# Patient Record
Sex: Male | Born: 1955 | Race: Asian | Hispanic: No | Marital: Married | Smoking: Never smoker
Health system: Southern US, Community
[De-identification: ages and names within clinical notes are randomized; demographics above are authoritative.]

## PROBLEM LIST (undated history)

## (undated) DIAGNOSIS — Z789 Other specified health status: Secondary | ICD-10-CM

## (undated) DIAGNOSIS — K219 Gastro-esophageal reflux disease without esophagitis: Secondary | ICD-10-CM

## (undated) HISTORY — DX: Other specified health status: Z78.9

## (undated) HISTORY — DX: Gastro-esophageal reflux disease without esophagitis: K21.9

---

## 1986-11-22 HISTORY — PX: APPENDECTOMY: SHX54

## 2013-04-05 ENCOUNTER — Other Ambulatory Visit: Payer: Self-pay | Admitting: Family Medicine

## 2013-04-05 ENCOUNTER — Ambulatory Visit
Admission: RE | Admit: 2013-04-05 | Discharge: 2013-04-05 | Disposition: A | Discharge status: Home / Self Care | Payer: BC Managed Care – PPO | Source: Ambulatory Visit | Attending: Family Medicine | Admitting: Family Medicine

## 2013-04-05 DIAGNOSIS — Z111 Encounter for screening for respiratory tuberculosis: Secondary | ICD-10-CM

## 2014-05-25 IMAGING — CR DG CHEST 2V
2 series · 2 of 2 positions shown · non-contrast
Comparison: None.

CLINICAL DATA: Clearance for travel, evaluate for tuberculosis

CHEST - 2 VIEW

[view not recorded (1 of 2)]
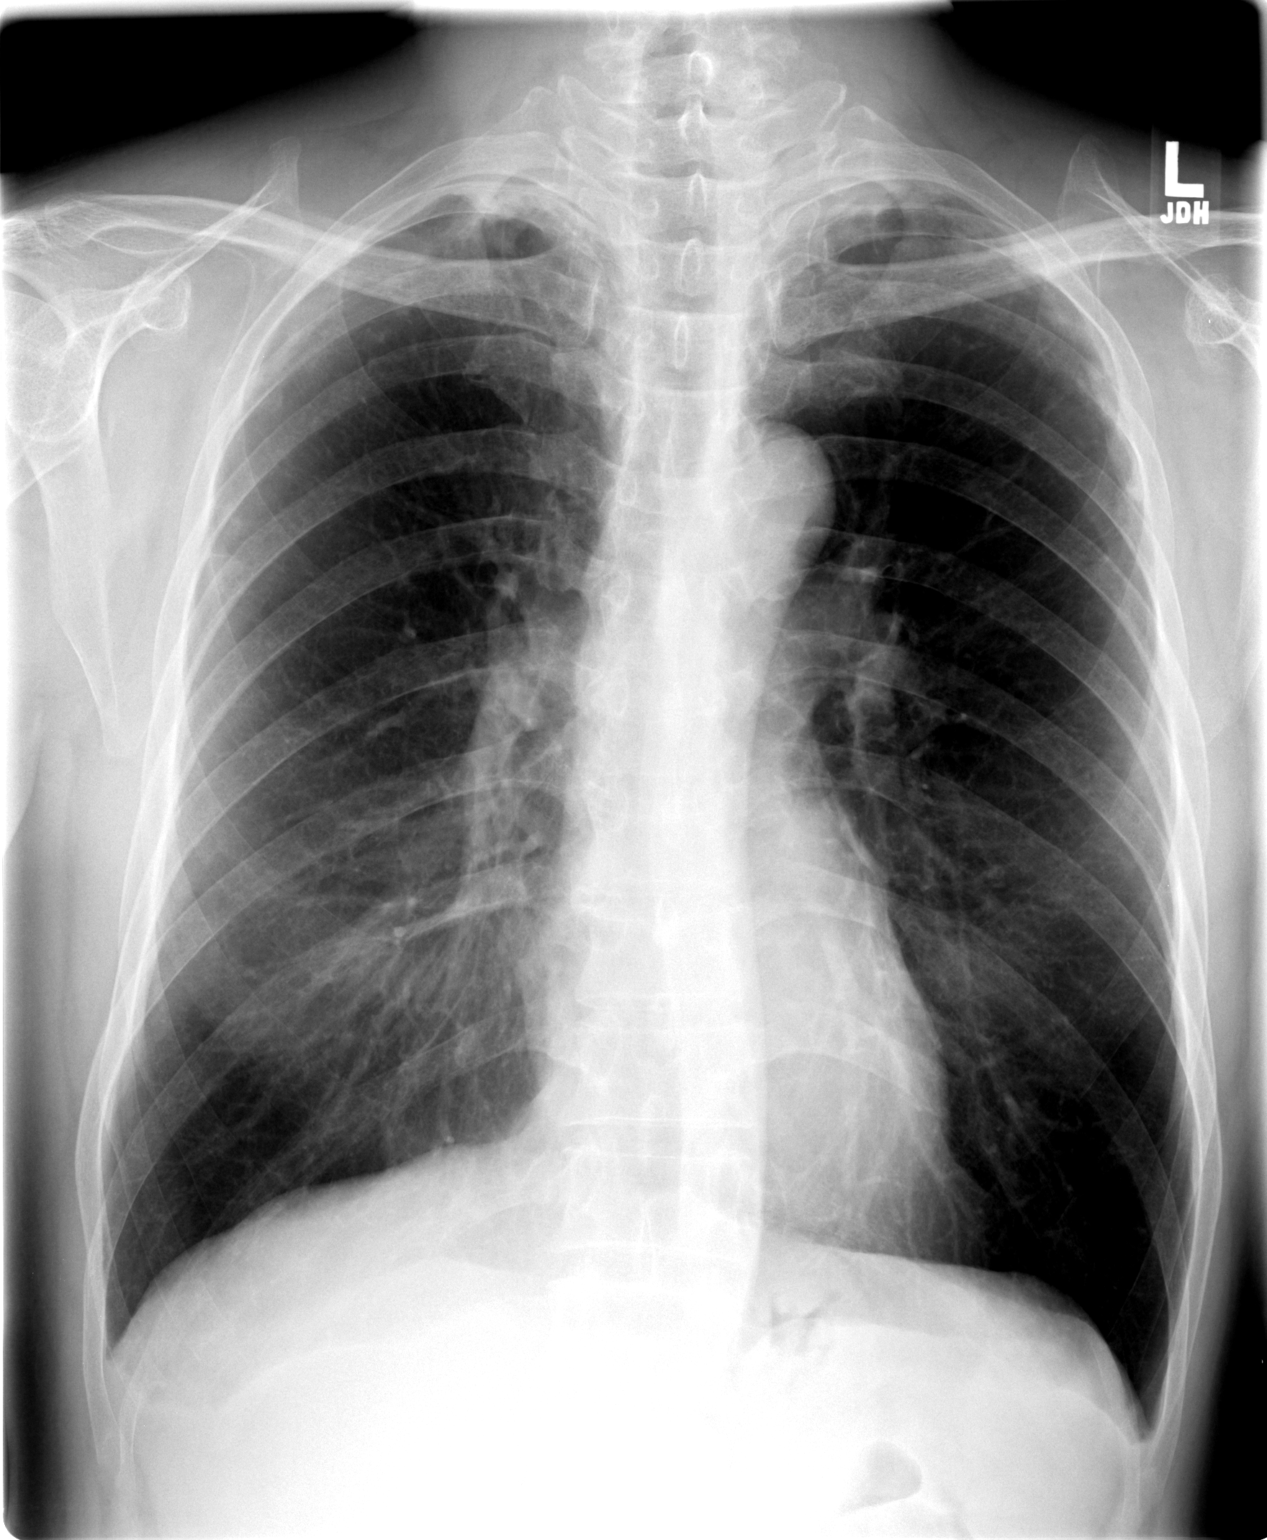

[view not recorded (2 of 2)]
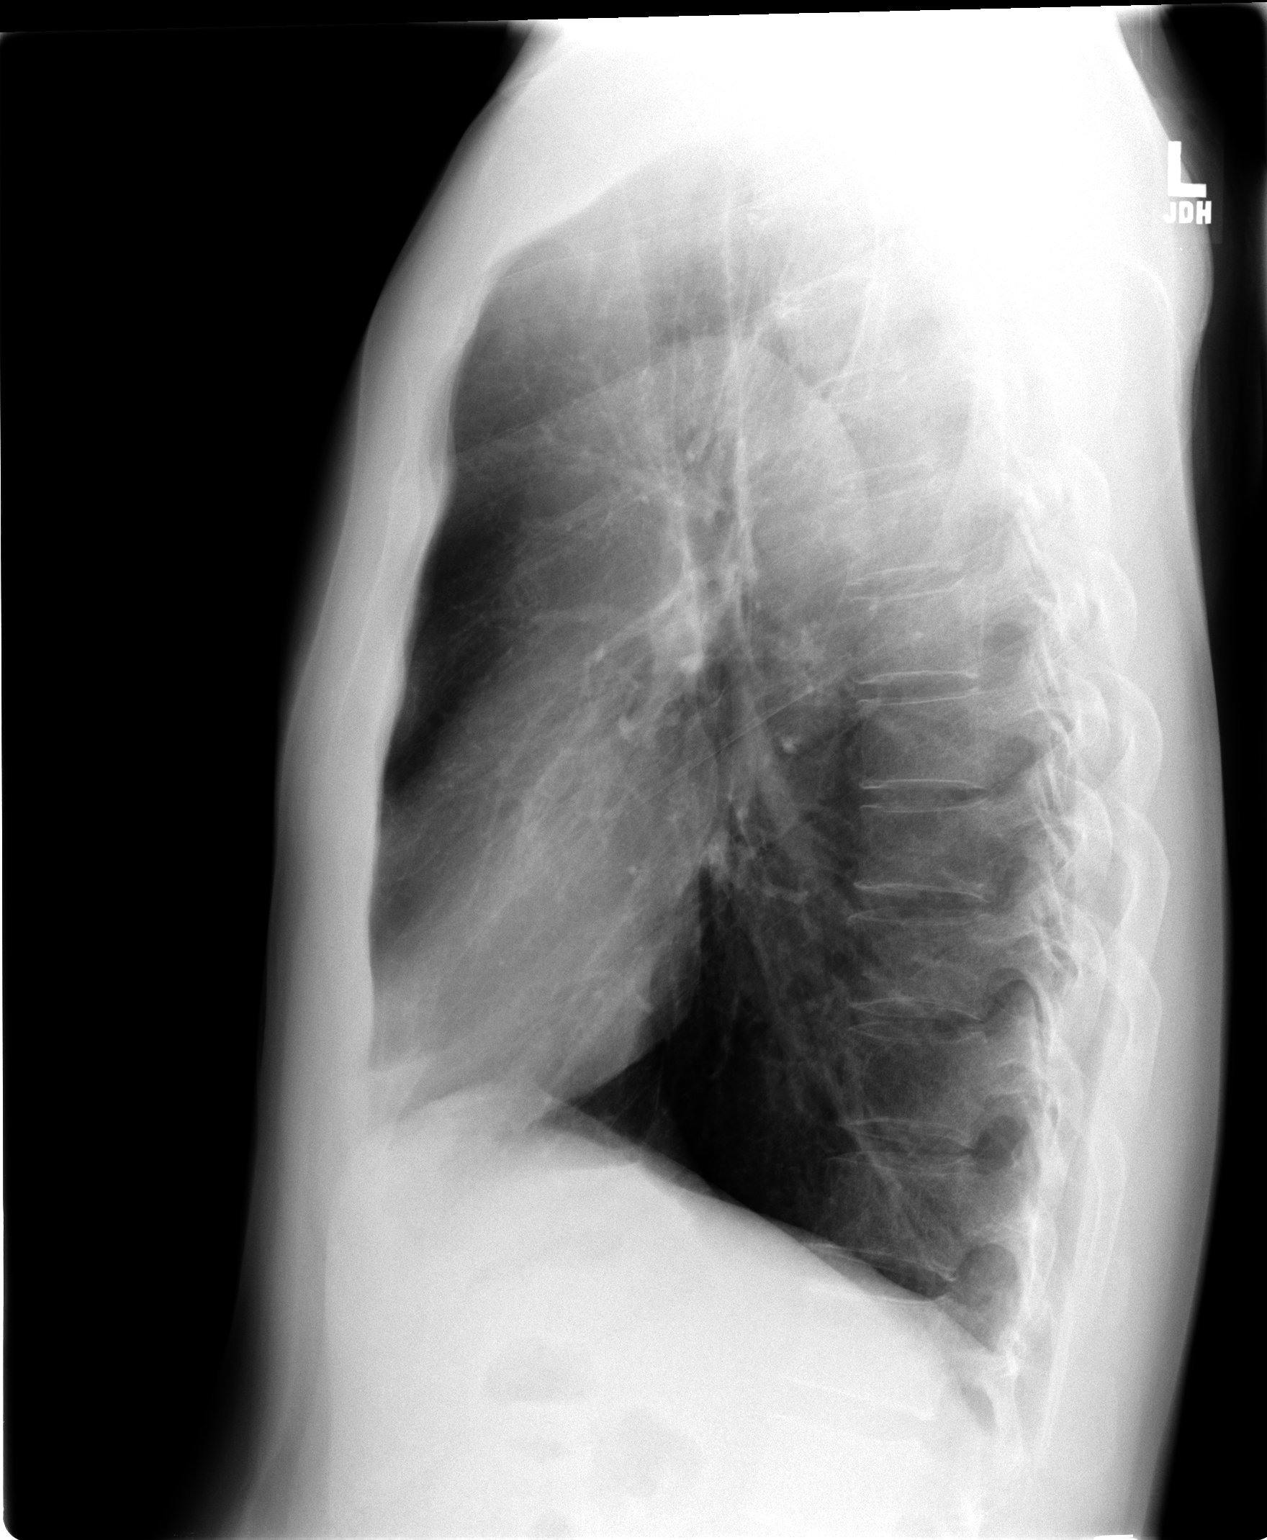

[2 of 2 positions shown; findings below may reference images not displayed]

FINDINGS: No active infiltrate or effusion is seen.  The lungs are
somewhat hyperaerated suggesting possible emphysema.  No sequela of
prior tuberculous infection is noted.  Mild apical pleural
thickening is noted.  Mediastinal contours are normal.  The heart
is within normal limits in size.  No bony abnormality is seen.
IMPRESSION: No active lung disease.  No evidence of tuberculosis.
Hyperaeration may indicate mild emphysematous change.

## 2022-04-09 ENCOUNTER — Telehealth: Payer: Self-pay | Admitting: Family Medicine

## 2022-04-09 NOTE — Telephone Encounter (Signed)
Myriam Jacobson (friend) called to set up NP appt for pt. Advised her that Dr. Carmelia Roller would have to give his approval to take him on as a pt. Please Advise.

## 2022-04-09 NOTE — Telephone Encounter (Signed)
Pt scheduled and NP packet sent. 

## 2022-04-09 NOTE — Telephone Encounter (Signed)
Ok to schedule.

## 2022-04-20 ENCOUNTER — Ambulatory Visit (INDEPENDENT_AMBULATORY_CARE_PROVIDER_SITE_OTHER): Payer: Medicare Other | Admitting: Family Medicine

## 2022-04-20 ENCOUNTER — Encounter: Payer: Self-pay | Admitting: Family Medicine

## 2022-04-20 VITALS — BP 110/78 | HR 84 | Temp 98.0°F | Ht 69.0 in | Wt 143.0 lb

## 2022-04-20 DIAGNOSIS — Z23 Encounter for immunization: Secondary | ICD-10-CM | POA: Diagnosis not present

## 2022-04-20 DIAGNOSIS — Z125 Encounter for screening for malignant neoplasm of prostate: Secondary | ICD-10-CM | POA: Diagnosis not present

## 2022-04-20 DIAGNOSIS — Z13220 Encounter for screening for lipoid disorders: Secondary | ICD-10-CM

## 2022-04-20 DIAGNOSIS — Z8781 Personal history of (healed) traumatic fracture: Secondary | ICD-10-CM

## 2022-04-20 DIAGNOSIS — Z0000 Encounter for general adult medical examination without abnormal findings: Secondary | ICD-10-CM

## 2022-04-20 DIAGNOSIS — R7301 Impaired fasting glucose: Secondary | ICD-10-CM | POA: Diagnosis not present

## 2022-04-20 NOTE — Progress Notes (Signed)
Chief Complaint  Patient presents with   New Patient (Initial Visit)    Well Male Micheal Hanson is here for a complete physical.   His last physical was >1 year ago.  Current diet: in general, a "healthy" diet.   Current exercise: pushups, walking Weight trend: stable Fatigue out of ordinary? No. Seat belt? Yes.   Advanced directive? No  Health maintenance Shingrix- No Colonoscopy- Yes Tetanus- Yes Hep C- Yes Pneumonia vaccine- No  Past Medical History:  Diagnosis Date   No known health problems      Past Surgical History:  Procedure Laterality Date   APPENDECTOMY  1988    Medications  Takes no meds routinely.     Allergies No Known Allergies  Family History Family History  Problem Relation Age of Onset   Diabetes Father    Cancer Neg Hx     Review of Systems: Constitutional:  no fevers Eye:  no recent significant change in vision Ears:  No changes in hearing Nose/Mouth/Throat:  no complaints of nasal congestion, no sore throat Cardiovascular: no chest pain Respiratory:  No shortness of breath Gastrointestinal:  No change in bowel habits GU:  No frequency Integumentary:  no abnormal skin lesions reported Neurologic:  no headaches Endocrine:  denies unexplained weight changes  Exam BP 110/78   Pulse 84   Temp 98 F (36.7 C) (Oral)   Ht 5\' 9"  (1.753 m)   Wt 143 lb (64.9 kg)   SpO2 99%   BMI 21.12 kg/m  General:  well developed, well nourished, in no apparent distress Skin:  no significant moles, warts, or growths Head:  no masses, lesions, or tenderness Eyes:  pupils equal and round, sclera anicteric without injection Ears:  canals without lesions, TMs shiny without retraction, no obvious effusion, no erythema Nose:  nares patent, septum midline, mucosa normal Throat/Pharynx:  lips and gingiva without lesion; tongue and uvula midline; non-inflamed pharynx; no exudates or postnasal drainage Lungs:  clear to auscultation, breath sounds equal  bilaterally, no respiratory distress Cardio:  regular rate and rhythm, no LE edema or bruits Rectal: Deferred GI: BS+, S, NT, ND, no masses or organomegaly Musculoskeletal:  symmetrical muscle groups noted without atrophy or deformity Neuro:  gait normal; deep tendon reflexes normal and symmetric Psych: well oriented with normal range of affect and appropriate judgment/insight  Assessment and Plan  Well adult exam  IFG (impaired fasting glucose) - Plan: Comprehensive metabolic panel, Hemoglobin A1c  Screening for lipid disorders - Plan: Lipid panel  Screening for prostate cancer - Plan: PSA, Medicare ( [place] only)  History of fracture of wrist - Plan: DG Bone Density   Well 66 y.o. male. Counseled on diet and exercise. Shingrix rec'd.  PCV20 today. UTD w covid vaccines.  Advanced directive form provided today.  Other orders as above. Follow up in 1 yr or prn.  The patient voiced understanding and agreement to the plan.  [place], DO 04/20/22 3:08 PM

## 2022-04-20 NOTE — Patient Instructions (Addendum)
Give [place] 2-3 business days to get the results of your labs back.   Keep the diet clean and stay active.  Please get me a copy of your advanced directive form at your convenience.   Someone will reach out regarding your bone density scan.   You are up to date with the tetanus shot.   You shouldn't need another pneumonia shot after this one unless something new comes out.   Let us know if you need anything.

## 2022-04-20 NOTE — Addendum Note (Signed)
Addended by: Kathi Ludwig on: 04/20/2022 03:18 PM   Modules accepted: Orders

## 2022-04-21 LAB — COMPREHENSIVE METABOLIC PANEL
ALT: 16 U/L (ref 0–53)
AST: 18 U/L (ref 0–37)
Albumin: 4.6 g/dL (ref 3.5–5.2)
Alkaline Phosphatase: 65 U/L (ref 39–117)
BUN: 14 mg/dL (ref 6–23)
CO2: 31 mEq/L (ref 19–32)
Calcium: 9.7 mg/dL (ref 8.4–10.5)
Chloride: 100 mEq/L (ref 96–112)
Creatinine, Ser: 0.99 mg/dL (ref 0.40–1.50)
GFR: 79.57 mL/min (ref >60.00)
Glucose, Bld: 85 mg/dL (ref 70–99)
Potassium: 3.9 mEq/L (ref 3.5–5.1)
Sodium: 140 mEq/L (ref 135–145)
Total Bilirubin: 1 mg/dL (ref 0.2–1.2)
Total Protein: 7.4 g/dL (ref 6.0–8.3)

## 2022-04-21 LAB — PSA, MEDICARE: PSA: 1.15 ng/ml (ref 0.10–4.00)

## 2022-04-21 LAB — LIPID PANEL
Cholesterol: 197 mg/dL (ref 0–200)
HDL: 56.4 mg/dL (ref >39.00)
LDL Cholesterol: 125 mg/dL — ABNORMAL HIGH (ref 0–99)
NonHDL: 140.98
Total CHOL/HDL Ratio: 3
Triglycerides: 78 mg/dL (ref 0.0–149.0)
VLDL: 15.6 mg/dL (ref 0.0–40.0)

## 2022-04-21 LAB — HEMOGLOBIN A1C: Hgb A1c MFr Bld: 5.6 % (ref 4.6–6.5)

## 2022-04-29 ENCOUNTER — Ambulatory Visit (HOSPITAL_BASED_OUTPATIENT_CLINIC_OR_DEPARTMENT_OTHER)
Admission: RE | Admit: 2022-04-29 | Discharge: 2022-04-29 | Disposition: A | Discharge status: Home / Self Care | Payer: Medicare Other | Source: Ambulatory Visit | Attending: Family Medicine | Admitting: Family Medicine

## 2022-04-29 DIAGNOSIS — M8589 Other specified disorders of bone density and structure, multiple sites: Secondary | ICD-10-CM | POA: Insufficient documentation

## 2022-04-29 DIAGNOSIS — Z13820 Encounter for screening for osteoporosis: Secondary | ICD-10-CM | POA: Diagnosis not present

## 2022-04-29 DIAGNOSIS — Z8781 Personal history of (healed) traumatic fracture: Secondary | ICD-10-CM | POA: Diagnosis not present

## 2022-05-04 ENCOUNTER — Telehealth: Payer: Self-pay

## 2022-05-04 NOTE — Telephone Encounter (Signed)
Micheal Hanson called- Pt was charged $25 co pay at his visit on 04/20/22- but insurance card states that copay is $0- she would like to know why Pt was required to pay a copay. She can be reached at [phone].

## 2022-05-05 NOTE — Telephone Encounter (Signed)
Left a message on Micheal Hanson's VM to call office back regarding the $25 co-payment on 04/20/22.  Sometimes the system will pull a payment that is not accurate and in this case it did.  Billing Leadership will make a credit to the patient's account in the amount of $25.00 for the correction of the mistake.

## 2022-05-18 DIAGNOSIS — H524 Presbyopia: Secondary | ICD-10-CM | POA: Diagnosis not present

## 2022-05-18 DIAGNOSIS — H2513 Age-related nuclear cataract, bilateral: Secondary | ICD-10-CM | POA: Diagnosis not present

## 2022-05-18 DIAGNOSIS — H52223 Regular astigmatism, bilateral: Secondary | ICD-10-CM | POA: Diagnosis not present

## 2022-05-18 DIAGNOSIS — H5213 Myopia, bilateral: Secondary | ICD-10-CM | POA: Diagnosis not present

## 2023-06-14 ENCOUNTER — Telehealth: Payer: Self-pay | Admitting: Family Medicine

## 2023-06-14 NOTE — Telephone Encounter (Signed)
Copied from CRM [id number]. Topic: Medicare AWV >> Jun 14, 2023  1:59 PM Payton Doughty wrote: Reason for CRM: Called 06/14/2023 to sched AWV - NO VOICEMAIL  Verlee Rossetti; Care Guide Ambulatory Clinical Support [facility] Direct Dial: [phone]

## 2024-01-17 ENCOUNTER — Ambulatory Visit (INDEPENDENT_AMBULATORY_CARE_PROVIDER_SITE_OTHER): Payer: Medicare Other | Admitting: Family Medicine

## 2024-01-17 ENCOUNTER — Other Ambulatory Visit (HOSPITAL_BASED_OUTPATIENT_CLINIC_OR_DEPARTMENT_OTHER): Payer: Self-pay

## 2024-01-17 ENCOUNTER — Encounter: Payer: Self-pay | Admitting: Family Medicine

## 2024-01-17 VITALS — BP 130/78 | HR 92 | Temp 98.0°F | Resp 16 | Ht 69.0 in | Wt 140.2 lb

## 2024-01-17 DIAGNOSIS — H9193 Unspecified hearing loss, bilateral: Secondary | ICD-10-CM | POA: Diagnosis not present

## 2024-01-17 DIAGNOSIS — Z125 Encounter for screening for malignant neoplasm of prostate: Secondary | ICD-10-CM | POA: Diagnosis not present

## 2024-01-17 DIAGNOSIS — E7800 Pure hypercholesterolemia, unspecified: Secondary | ICD-10-CM

## 2024-01-17 DIAGNOSIS — Z0000 Encounter for general adult medical examination without abnormal findings: Secondary | ICD-10-CM | POA: Diagnosis not present

## 2024-01-17 DIAGNOSIS — R634 Abnormal weight loss: Secondary | ICD-10-CM | POA: Diagnosis not present

## 2024-01-17 MED ORDER — ZOSTER VAC RECOMB ADJUVANTED 50 MCG/0.5ML IM SUSR
0.5000 mL | Freq: Once | INTRAMUSCULAR | 0 refills | Status: AC
  Filled 2024-01-17: qty 0.5, 1d supply, fill #0

## 2024-01-17 MED ORDER — TETANUS-DIPHTH-ACELL PERTUSSIS 5-2.5-18.5 LF-MCG/0.5 IM SUSY
0.5000 mL | PREFILLED_SYRINGE | Freq: Once | INTRAMUSCULAR | 0 refills | Status: AC
  Filled 2024-01-17: qty 0.5, 1d supply, fill #0

## 2024-01-17 NOTE — Progress Notes (Signed)
 Chief Complaint  Patient presents with   Follow-up    Follow up    Well Male Micheal Hanson is here for a complete physical.  Here w wife and friend who helps interpret.  His last physical was >1 year ago.  Current diet: in general, a "healthy" diet.   Current exercise: walking Weight trend: lost a few lbs Fatigue out of ordinary? Not out of ordinary. Seat belt? Yes.   Advanced directive? No  Health maintenance Shingrix- No Colonoscopy- Yes Tetanus- Due Hep C- Yes Pneumonia vaccine- Yes  Past Medical History:  Diagnosis Date   No known health problems      Past Surgical History:  Procedure Laterality Date   APPENDECTOMY  1988    Medications  Takes no meds routinely.    Allergies No Known Allergies  Family History Family History  Problem Relation Age of Onset   Diabetes Father    Cancer Neg Hx     Review of Systems: Constitutional:  no fevers Eye:  no recent significant change in vision Ears:  + Hearing loss Nose/Mouth/Throat:  no complaints of nasal congestion, no sore throat Cardiovascular: no chest pain Respiratory:  No shortness of breath Gastrointestinal:  No change in bowel habits GU:  No frequency Integumentary:  no abnormal skin lesions reported Neurologic:  no headaches Endocrine:  denies unexplained weight changes  Exam BP 130/78 (BP Location: Left Arm, Patient Position: Sitting)   Pulse 92   Temp 98 F (36.7 C) (Oral)   Resp 16   Ht 5\' 9"  (1.753 m)   Wt 140 lb 3.2 oz (63.6 kg)   SpO2 97%   BMI 20.70 kg/m  General:  well developed, well nourished, in no apparent distress Skin:  no significant moles, warts, or growths Head:  no masses, lesions, or tenderness Eyes:  pupils equal and round, sclera anicteric without injection Ears: 80% of canal with cerumen obstruction on the right, otherwise canals without lesions, TMs shiny without retraction, no obvious effusion, no erythema Nose:  nares patent, mucosa normal Throat/Pharynx:  lips and  gingiva without lesion; tongue and uvula midline; non-inflamed pharynx; no exudates or postnasal drainage Lungs:  clear to auscultation, breath sounds equal bilaterally, no respiratory distress Cardio:  regular rate and rhythm, no LE edema or bruits Rectal: Deferred GI: BS+, S, NT, ND, no masses or organomegaly Musculoskeletal:  symmetrical muscle groups noted without atrophy or deformity Neuro:  gait normal; deep tendon reflexes normal and symmetric Psych: well oriented with normal range of affect and appropriate judgment/insight  Assessment and Plan  Well adult exam  Pure hypercholesterolemia - Plan: Comprehensive metabolic panel, Lipid panel  Screening for prostate cancer - Plan: PSA, Medicare ( [place] only)   Well 68 y.o. male. Counseled on diet and exercise. Tetanus booster and Shingrix rec'd to get at pharmacy.  Advanced directive form provided today.  Other orders as above. Follow up in 1 year.  Annual wellness visit at convenience. The patient, his spouse, and his friend voiced understanding and agreement to the plan.  Jilda Roche [place], DO 01/17/24 2:08 PM

## 2024-01-17 NOTE — Patient Instructions (Addendum)
 Give [place] 2-3 business days to get the results of your labs back.   Keep the diet clean and stay active.  Please get me a copy of your advanced directive form at your convenience.   The Shingrix vaccine (for shingles) is a 2 shot series spaced 2-6 months apart. It can make people feel low energy, achy and almost like they have the flu for 48 hours after injection. 1/5 people can have nausea and/or vomiting. Please plan accordingly when deciding on when to get this shot. Call your pharmacy to get this. The second shot of the series is less severe regarding the side effects, but it still lasts 48 hours.   Please consider getting the tetanus booster at the pharmacy.   If you do not hear anything about your referral in the next 1-2 weeks, call our office and ask for an update.  Let us know if you need anything.

## 2024-01-20 ENCOUNTER — Other Ambulatory Visit (INDEPENDENT_AMBULATORY_CARE_PROVIDER_SITE_OTHER): Payer: Medicare Other

## 2024-01-20 ENCOUNTER — Other Ambulatory Visit (HOSPITAL_BASED_OUTPATIENT_CLINIC_OR_DEPARTMENT_OTHER): Payer: Self-pay

## 2024-01-20 ENCOUNTER — Encounter: Payer: Self-pay | Admitting: Family Medicine

## 2024-01-20 ENCOUNTER — Other Ambulatory Visit: Payer: Self-pay

## 2024-01-20 DIAGNOSIS — R634 Abnormal weight loss: Secondary | ICD-10-CM | POA: Diagnosis not present

## 2024-01-20 DIAGNOSIS — Z125 Encounter for screening for malignant neoplasm of prostate: Secondary | ICD-10-CM

## 2024-01-20 DIAGNOSIS — E7800 Pure hypercholesterolemia, unspecified: Secondary | ICD-10-CM

## 2024-01-20 DIAGNOSIS — R7989 Other specified abnormal findings of blood chemistry: Secondary | ICD-10-CM

## 2024-01-20 LAB — TSH: TSH: 5.67 u[IU]/mL — ABNORMAL HIGH (ref 0.35–5.50)

## 2024-01-20 LAB — LIPID PANEL
Cholesterol: 164 mg/dL (ref 0–200)
HDL: 61.2 mg/dL (ref >39.00)
LDL Cholesterol: 92 mg/dL (ref 0–99)
NonHDL: 102.47
Total CHOL/HDL Ratio: 3
Triglycerides: 50 mg/dL (ref 0.0–149.0)
VLDL: 10 mg/dL (ref 0.0–40.0)

## 2024-01-20 LAB — PSA, MEDICARE: PSA: 1.51 ng/mL (ref 0.10–4.00)

## 2024-01-20 LAB — COMPREHENSIVE METABOLIC PANEL
ALT: 34 U/L (ref 0–53)
AST: 28 U/L (ref 0–37)
Albumin: 4.5 g/dL (ref 3.5–5.2)
Alkaline Phosphatase: 67 U/L (ref 39–117)
BUN: 17 mg/dL (ref 6–23)
CO2: 33 meq/L — ABNORMAL HIGH (ref 19–32)
Calcium: 9.7 mg/dL (ref 8.4–10.5)
Chloride: 101 meq/L (ref 96–112)
Creatinine, Ser: 0.93 mg/dL (ref 0.40–1.50)
GFR: 84.72 mL/min (ref >60.00)
Glucose, Bld: 97 mg/dL (ref 70–99)
Potassium: 4.6 meq/L (ref 3.5–5.1)
Sodium: 141 meq/L (ref 135–145)
Total Bilirubin: 0.6 mg/dL (ref 0.2–1.2)
Total Protein: 7.6 g/dL (ref 6.0–8.3)

## 2024-01-23 ENCOUNTER — Other Ambulatory Visit

## 2024-01-23 DIAGNOSIS — R7989 Other specified abnormal findings of blood chemistry: Secondary | ICD-10-CM | POA: Diagnosis not present

## 2024-01-24 ENCOUNTER — Encounter: Payer: Self-pay | Admitting: Family Medicine

## 2024-01-24 LAB — T4, FREE: Free T4: 1 ng/dL (ref 0.8–1.8)

## 2024-01-25 ENCOUNTER — Encounter: Payer: Self-pay | Admitting: Gastroenterology

## 2024-03-01 DIAGNOSIS — H2513 Age-related nuclear cataract, bilateral: Secondary | ICD-10-CM | POA: Diagnosis not present

## 2024-03-14 ENCOUNTER — Encounter: Payer: Self-pay | Admitting: Gastroenterology

## 2024-03-14 ENCOUNTER — Other Ambulatory Visit (HOSPITAL_BASED_OUTPATIENT_CLINIC_OR_DEPARTMENT_OTHER): Payer: Self-pay

## 2024-03-14 ENCOUNTER — Ambulatory Visit: Admitting: Gastroenterology

## 2024-03-14 VITALS — BP 108/70 | HR 76 | Ht 69.0 in | Wt 145.2 lb

## 2024-03-14 DIAGNOSIS — K219 Gastro-esophageal reflux disease without esophagitis: Secondary | ICD-10-CM | POA: Diagnosis not present

## 2024-03-14 DIAGNOSIS — Z1211 Encounter for screening for malignant neoplasm of colon: Secondary | ICD-10-CM | POA: Insufficient documentation

## 2024-03-14 MED ORDER — NA SULFATE-K SULFATE-MG SULF 17.5-3.13-1.6 GM/177ML PO SOLN
1.0000 | Freq: Once | ORAL | 0 refills | Status: AC
  Filled 2024-03-14: qty 354, 1d supply, fill #0

## 2024-03-14 NOTE — Patient Instructions (Signed)
 You have been scheduled for a colonoscopy. Please follow written instructions given to you at your visit today.   If you use inhalers (even only as needed), please bring them with you on the day of your procedure.  DO NOT TAKE 7 DAYS PRIOR TO TEST- Trulicity (dulaglutide) Ozempic, Wegovy (semaglutide) Mounjaro (tirzepatide) Bydureon Bcise (exanatide extended release)  DO NOT TAKE 1 DAY PRIOR TO YOUR TEST Rybelsus (semaglutide) Adlyxin (lixisenatide) Victoza (liraglutide) Byetta (exanatide) _____________________________________________________________________  _______________________________________________________  If your blood pressure at your visit was 140/90 or greater, please contact your primary care physician to follow up on this.  _______________________________________________________  If you are age 13 or older, your body mass index should be between 23-30. Your Body mass index is 21.44 kg/m. If this is out of the aforementioned range listed, please consider follow up with your Primary Care Provider.  If you are age 39 or younger, your body mass index should be between 19-25. Your Body mass index is 21.44 kg/m. If this is out of the aformentioned range listed, please consider follow up with your Primary Care Provider.   ________________________________________________________  The [facility] providers would like to encourage you to use MYCHART to communicate with providers for non-urgent requests or questions.  Due to long hold times on the telephone, sending your provider a message by [facility] may be a faster and more efficient way to get a response.  Please allow 48 business hours for a response.  Please remember that this is for non-urgent requests.  _______________________________________________________

## 2024-03-14 NOTE — Progress Notes (Signed)
 03/14/2024 Micheal Hanson [id number] May 30, 1956   HISTORY OF PRESENT ILLNESS: This is a 68 year old male who is new to our office.  He is here today for evaluation of GERD.  He is primarily [place] speaking so communication was performed via interpreter/translator.  The patient's wife also had a visit with me today and she was present during the entire visit as well.  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  They live primarily in [place] , but will be leaving to go to [place] at the end of June for the next 4 years or so.  He tells me that he had a colonoscopy in [place] about 8 years ago that he says was normal.  We do not have records.  He would like to have colonoscopy performed before he leaves the country again for the next few years.  Says he also has heartburn and reflux as well as a lot of belching.  He says that it has been going on for 15 to 20 years.  He was diagnosed with H. pylori in [place] about 8 years ago during endoscopy.  He has not taken any medication for symptoms, uses nothing over-the-counter as he prefers not to take medication.  No significant bowel issues, but stools do seem to vary according to what he eats.  No rectal bleeding.   Past Medical History:  Diagnosis Date   GERD (gastroesophageal reflux disease)    No known health problems    Past Surgical History:  Procedure Laterality Date   APPENDECTOMY  1988    reports that he has never smoked. He has never used smokeless tobacco. He reports that he does not currently use alcohol. He reports that he does not use drugs. family history includes Diabetes in his brother and father; Irritable bowel syndrome in his brother and sister. No Known Allergies    Outpatient Encounter Medications as of 03/14/2024  Medication Sig   Na Sulfate-K Sulfate-Mg Sulfate concentrate (SUPREP) 17.5-3.13-1.6 GM/177ML SOLN Take 1 kit (354 mLs total) by mouth once for 1 dose.   No facility-administered encounter  medications on file as of 03/14/2024.    REVIEW OF SYSTEMS  : All other systems reviewed and negative except where noted in the History of Present Illness.   PHYSICAL EXAM: BP 108/70   Pulse 76   Ht 5\' 9"  (1.753 m)   Wt 145 lb 3.2 oz (65.9 kg)   BMI 21.44 kg/m  General: Well developed [place] male in no acute distress Head: Normocephalic and atraumatic Eyes:  Sclerae anicteric, conjunctiva pink. Ears: Normal auditory acuity Lungs: Clear throughout to auscultation; no W/R/R. Heart: Regular rate and rhythm; no M/R/G. Rectal:  Will be done during the time of colonoscopy. Musculoskeletal: Symmetrical with no gross deformities  Skin: No lesions on visible extremities Neurological: Alert oriented x 4, grossly non-focal Psychological:  Alert and cooperative. Normal mood and affect  ASSESSMENT AND PLAN: *Colon cancer screening: Patient reports normal colonoscopy roughly 8 years ago in [place].  No records available.  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  He would like to have this repeated before he goes.  Will schedule Dr. Cherryl Corona. *GERD: Reports heartburn/reflux and belching for 15 to 20 years.  EGD about 8 years ago in [place] diagnosed H. pylori.  He does not use anything for his  symptoms, does not use anything over-the-counter.  He says he prefers not to take medication.  Will evaluate with EGD as well.  **The risks, benefits, and alternatives to EGD and colonoscopy were discussed with the patient and he consents to proceed.   **Of note, the patient and his wife wanted to have the procedures performed on the same day and had a particular day that they were requesting so they were scheduled with Dr. Cherryl Corona.  CC:  Gwenette Lennox, Shellie Dials*

## 2024-03-15 ENCOUNTER — Other Ambulatory Visit (HOSPITAL_BASED_OUTPATIENT_CLINIC_OR_DEPARTMENT_OTHER): Payer: Self-pay

## 2024-03-21 NOTE — Progress Notes (Signed)
 Agree with the assessment and plan as outlined by Doug Sou, PA-C.  Caisley Baxendale E. Tomasa Rand, MD  [facility]

## 2024-04-10 ENCOUNTER — Ambulatory Visit (INDEPENDENT_AMBULATORY_CARE_PROVIDER_SITE_OTHER): Admitting: Family Medicine

## 2024-04-10 ENCOUNTER — Other Ambulatory Visit (HOSPITAL_BASED_OUTPATIENT_CLINIC_OR_DEPARTMENT_OTHER): Payer: Self-pay

## 2024-04-10 ENCOUNTER — Encounter: Payer: Self-pay | Admitting: Family Medicine

## 2024-04-10 ENCOUNTER — Ambulatory Visit: Payer: Self-pay | Admitting: Family Medicine

## 2024-04-10 VITALS — BP 126/72 | HR 98 | Temp 98.0°F | Resp 16 | Ht 69.0 in | Wt 147.0 lb

## 2024-04-10 DIAGNOSIS — M8580 Other specified disorders of bone density and structure, unspecified site: Secondary | ICD-10-CM | POA: Diagnosis not present

## 2024-04-10 DIAGNOSIS — R5383 Other fatigue: Secondary | ICD-10-CM

## 2024-04-10 LAB — COMPREHENSIVE METABOLIC PANEL WITH GFR
ALT: 22 U/L (ref 0–53)
AST: 17 U/L (ref 0–37)
Albumin: 4.3 g/dL (ref 3.5–5.2)
Alkaline Phosphatase: 70 U/L (ref 39–117)
BUN: 14 mg/dL (ref 6–23)
CO2: 30 meq/L (ref 19–32)
Calcium: 9.5 mg/dL (ref 8.4–10.5)
Chloride: 104 meq/L (ref 96–112)
Creatinine, Ser: 0.91 mg/dL (ref 0.40–1.50)
GFR: 86.83 mL/min (ref >60.00)
Glucose, Bld: 93 mg/dL (ref 70–99)
Potassium: 4.1 meq/L (ref 3.5–5.1)
Sodium: 141 meq/L (ref 135–145)
Total Bilirubin: 0.8 mg/dL (ref 0.2–1.2)
Total Protein: 7.4 g/dL (ref 6.0–8.3)

## 2024-04-10 LAB — VITAMIN D 25 HYDROXY (VIT D DEFICIENCY, FRACTURES): VITD: 27.31 ng/mL — ABNORMAL LOW (ref 30.00–100.00)

## 2024-04-10 LAB — TSH: TSH: 4.54 u[IU]/mL (ref 0.35–5.50)

## 2024-04-10 LAB — T4, FREE: Free T4: 0.65 ng/dL (ref 0.60–1.60)

## 2024-04-10 MED ORDER — VITAMIN D (ERGOCALCIFEROL) 1.25 MG (50000 UNIT) PO CAPS
50000.0000 [IU] | ORAL_CAPSULE | ORAL | 0 refills | Status: AC
Start: 2024-04-10 — End: ?
  Filled 2024-04-10: qty 12, 84d supply, fill #0

## 2024-04-10 NOTE — Progress Notes (Signed)
 Chief Complaint  Patient presents with   Follow-up    Follow Up    Subjective: Patient is a 68 y.o. male here for fatigue. Here w wife.   Over past 3 mo, has been having more fatigue. TSH was elevated but T4 was normal. Gets around 6 hrs of sleep nightly. Diet is healthy overall. He does walk routinely. No mood issues, witnessed apneic episodes.  Denies any pain, bleeding, nausea, vomiting, diarrhea.  Patient has a history of osteopenia, most recent T-score was -2.3 on 04/29/2022.  He does take vitamin D but he does not supplement with calcium.  Walking as above.  Past Medical History:  Diagnosis Date   GERD (gastroesophageal reflux disease)    No known health problems     Objective: BP 126/72 (BP Location: Left Arm, Patient Position: Sitting)   Pulse 98   Temp 98 F (36.7 C) (Oral)   Resp 16   Ht 5\' 9"  (1.753 m)   Wt 147 lb (66.7 kg)   SpO2 96%   BMI 21.71 kg/m  General: Awake, appears stated age Heart: RRR, no LE edema Lungs: CTAB, no rales, wheezes or rhonchi. No accessory muscle use Mouth: MMM Neuro: Gait is normal, DTRs equal and symmetric in lower extremities without clonus Psych: Age appropriate judgment and insight, normal affect and mood  Assessment and Plan: Fatigue, unspecified type - Plan: TSH, T4, free  Osteopenia, unspecified location - Plan: DG Bone Density, VITAMIN D 25 Hydroxy (Vit-D Deficiency, Fractures), Comprehensive metabolic panel with GFR  Likely multifactorial.  Will check above labs to rule out any metabolic involvement.  Counseled on diet and exercise.  7 to 9 hours of sleep recommended nightly.  No concern for mood issues or sleep apnea. Calcium and vitamin D supplementation recommended.  Check labs.  Order follow-up bone density scan to be done after 6/8. The patient voiced understanding and agreement to the plan.  Shellie Dials [place], DO 04/10/24  9:51 AM

## 2024-04-10 NOTE — Patient Instructions (Addendum)
 Take 1200 mg of calcium daily and at least 1000 units of vitamin D3 daily.   Give us  2-3 business days to get the results of your labs back.   Keep the diet clean and stay active.  Try to get 7-9 hrs of sleep nightly.   Someone will reach out to schedule your bone density scan to be done after 04/29/24.  Let us  know if you need anything.

## 2024-04-24 ENCOUNTER — Encounter: Payer: Self-pay | Admitting: Gastroenterology

## 2024-05-02 ENCOUNTER — Ambulatory Visit: Admitting: Gastroenterology

## 2024-05-02 ENCOUNTER — Encounter: Payer: Self-pay | Admitting: Gastroenterology

## 2024-05-02 VITALS — BP 109/70 | HR 82 | Temp 97.8°F | Resp 17 | Ht 69.0 in | Wt 145.0 lb

## 2024-05-02 DIAGNOSIS — A048 Other specified bacterial intestinal infections: Secondary | ICD-10-CM | POA: Diagnosis not present

## 2024-05-02 DIAGNOSIS — D120 Benign neoplasm of cecum: Secondary | ICD-10-CM | POA: Diagnosis not present

## 2024-05-02 DIAGNOSIS — Q439 Congenital malformation of intestine, unspecified: Secondary | ICD-10-CM | POA: Diagnosis not present

## 2024-05-02 DIAGNOSIS — K2950 Unspecified chronic gastritis without bleeding: Secondary | ICD-10-CM

## 2024-05-02 DIAGNOSIS — K219 Gastro-esophageal reflux disease without esophagitis: Secondary | ICD-10-CM

## 2024-05-02 DIAGNOSIS — K2970 Gastritis, unspecified, without bleeding: Secondary | ICD-10-CM

## 2024-05-02 DIAGNOSIS — K31A15 Gastric intestinal metaplasia without dysplasia, involving multiple sites: Secondary | ICD-10-CM

## 2024-05-02 DIAGNOSIS — K3189 Other diseases of stomach and duodenum: Secondary | ICD-10-CM | POA: Diagnosis not present

## 2024-05-02 DIAGNOSIS — K31A0 Gastric intestinal metaplasia, unspecified: Secondary | ICD-10-CM | POA: Diagnosis not present

## 2024-05-02 DIAGNOSIS — Z1211 Encounter for screening for malignant neoplasm of colon: Secondary | ICD-10-CM | POA: Diagnosis not present

## 2024-05-02 DIAGNOSIS — D128 Benign neoplasm of rectum: Secondary | ICD-10-CM

## 2024-05-02 DIAGNOSIS — B9681 Helicobacter pylori [H. pylori] as the cause of diseases classified elsewhere: Secondary | ICD-10-CM | POA: Diagnosis not present

## 2024-05-02 DIAGNOSIS — K621 Rectal polyp: Secondary | ICD-10-CM

## 2024-05-02 DIAGNOSIS — K31A19 Gastric intestinal metaplasia without dysplasia, unspecified site: Secondary | ICD-10-CM | POA: Diagnosis not present

## 2024-05-02 MED ORDER — SODIUM CHLORIDE 0.9 % IV SOLN: 500.0000 mL | Freq: Once | INTRAVENOUS | Status: DC

## 2024-05-02 NOTE — Progress Notes (Signed)
 Sedate, gd SR, tolerated procedure well, VSS, report to RN

## 2024-05-02 NOTE — Op Note (Addendum)
 [facility] Patient Name: Newel Oien Procedure Date: 05/02/2024 8:13 AM MRN: [id number] Endoscopist: Geralyn Knee E. Cherryl Corona , MD, [id number] Age: 68 Referring MD:  Date of Birth: August 11, 1956 Gender: Male Account #: [id number] Procedure:                Colonoscopy Indications:              Screening for colorectal malignant neoplasm Medicines:                Monitored Anesthesia Care Procedure:                Pre-Anesthesia Assessment:                           - Prior to the procedure, a History and Physical                            was performed, and patient medications and                            allergies were reviewed. The patient's tolerance of                            previous anesthesia was also reviewed. The risks                            and benefits of the procedure and the sedation                            options and risks were discussed with the patient.                            All questions were answered, and informed consent                            was obtained. Prior Anticoagulants: The patient has                            taken no anticoagulant or antiplatelet agents. ASA                            Grade Assessment: I - A normal, healthy patient.                            After reviewing the risks and benefits, the patient                            was deemed in satisfactory condition to undergo the                            procedure.                           After obtaining informed consent, the colonoscope  was passed under direct vision. Throughout the                            procedure, the patient's blood pressure, pulse, and                            oxygen saturations were monitored continuously. The                            [id number]-HQ190L Colonoscope [id number] was introduced                            through the anus and advanced to the the cecum,                            identified by appendiceal orifice  and ileocecal                            valve. The colonoscopy was somewhat difficult due                            to a tortuous colon. Successful completion of the                            procedure was aided by using manual pressure. The                            patient tolerated the procedure well. The quality                            of the bowel preparation was good. The ileocecal                            valve, appendiceal orifice, and rectum were                            photographed. The bowel preparation used was SUPREP                            via split dose instruction. Scope In: 8:57:28 AM Scope Out: 9:23:15 AM Scope Withdrawal Time: 0 hours 15 minutes 59 seconds  Total Procedure Duration: 0 hours 25 minutes 47 seconds  Findings:                 The perianal and digital rectal examinations were                            normal. Pertinent negatives include normal                            sphincter tone and no palpable rectal lesions.                           A 4 mm polyp was found in the cecum.  The polyp was                            flat and mucous-capped. The polyp was removed with                            a cold snare. Resection and retrieval were                            complete. Estimated blood loss was minimal.                           Many sessile polyps were found in the rectum. The                            polyps were small in size. Two of these polyps were                            removed with a cold snare. Resection and retrieval                            were complete. Estimated blood loss was minimal.                           The exam was otherwise normal throughout the                            examined colon.                           The retroflexed view of the distal rectum and anal                            verge was normal and showed no anal or rectal                            abnormalities. Complications:            No  immediate complications. Estimated Blood Loss:     Estimated blood loss was minimal. Impression:               - One 4 mm polyp in the cecum, removed with a cold                            snare. Resected and retrieved.                           - Many small polyps in the rectum, removed with a                            cold snare. Resected and retrieved. Suspect these                            are hyperplastic polyps.                           -  The distal rectum and anal verge are normal on                            retroflexion view. Recommendation:           - Patient has a contact number available for                            emergencies. The signs and symptoms of potential                            delayed complications were discussed with the                            patient. Return to normal activities tomorrow.                            Written discharge instructions were provided to the                            patient.                           - Resume previous diet.                           - Continue present medications.                           - Await pathology results.                           - Repeat colonoscopy (date not yet determined) for                            surveillance based on pathology results. Star Cheese E. Cherryl Corona, MD 05/02/2024 9:35:21 AM This report has been signed electronically.

## 2024-05-02 NOTE — Progress Notes (Signed)
 Called to room to assist during endoscopic procedure.  Patient ID and intended procedure confirmed with present staff. Received instructions for my participation in the procedure from the performing physician.

## 2024-05-02 NOTE — Op Note (Signed)
 [facility] Patient Name: Micheal Hanson Procedure Date: 05/02/2024 8:12 AM MRN: [id number] Endoscopist: Geralyn Knee E. Cherryl Corona , MD, [id number] Age: 68 Referring MD:  Date of Birth: Dec 28, 1955 Gender: Male Account #: [id number] Procedure:                Upper GI endoscopy Indications:              Screening for Barrett's esophagus Medicines:                Monitored Anesthesia Care Procedure:                Pre-Anesthesia Assessment:                           - Prior to the procedure, a History and Physical                            was performed, and patient medications and                            allergies were reviewed. The patient's tolerance of                            previous anesthesia was also reviewed. The risks                            and benefits of the procedure and the sedation                            options and risks were discussed with the patient.                            All questions were answered, and informed consent                            was obtained. Prior Anticoagulants: The patient has                            taken no anticoagulant or antiplatelet agents. ASA                            Grade Assessment: I - A normal, healthy patient.                            After reviewing the risks and benefits, the patient                            was deemed in satisfactory condition to undergo the                            procedure.                           After obtaining informed consent, the endoscope was  passed under direct vision. Throughout the                            procedure, the patient's blood pressure, pulse, and                            oxygen saturations were monitored continuously. The                            Olympus Scope SN [id number] was introduced through the                            mouth, and advanced to the second part of duodenum.                            The upper GI endoscopy was  accomplished without                            difficulty. The patient tolerated the procedure                            well. Scope In: Scope Out: Findings:                 The examined portions of the nasopharynx,                            oropharynx and larynx were normal.                           The examined esophagus was normal.                           Diffuse moderate inflammation characterized by                            erythema was found in the gastric fundus and on the                            greater curvature of the gastric body. Biopsies                            were taken with a cold forceps for Helicobacter                            pylori testing. Estimated blood loss was minimal.                           A focal mucosal change characterized by yellowish                            discoloration was found in the gastric body.                            Biopsies were taken with a  cold forceps for                            histology. Estimated blood loss was minimal.                           The exam of the stomach was otherwise normal.                           Biopsies were taken with a cold forceps in the                            gastric antrum for Helicobacter pylori testing.                            Estimated blood loss was minimal.                           The examined duodenum was normal. Complications:            No immediate complications. Estimated Blood Loss:     Estimated blood loss was minimal. Impression:               - The examined portions of the nasopharynx,                            oropharynx and larynx were normal.                           - Normal esophagus. No evidence of Barrett's                            esophagus or reflux esophagitis.                           - Gastritis. Biopsied.                           - Focal yellowish discolored mucosa in the gastric                            body. Biopsied. 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                           - Biopsies were taken with a cold forceps for                            Helicobacter pylori testing. Recommendation:           - Patient has a contact number available for                            emergencies. The signs and symptoms of potential  delayed complications were discussed with the                            patient. Return to normal activities tomorrow.                            Written discharge instructions were provided to the                            patient.                           - Resume previous diet.                           - Continue present medications.                           - Await pathology results. [name] E. Cherryl Corona, MD 05/02/2024 9:30:34 AM This report has been signed electronically.

## 2024-05-02 NOTE — Progress Notes (Signed)
 Pt's states no medical or surgical changes since previsit or office visit.   Interpreter services used. Interpreters name [employer] # [phone]

## 2024-05-02 NOTE — Progress Notes (Signed)
 [facility] History and Physical   Primary Care Physician:  Jobe Mulder, DO   Reason for Procedure:   GERD, colon cancer screening  Plan:    EGD, colonoscopy     HPI: Micheal Hanson is a 68 y.o. male undergoing average risk screening colonoscopy.  He has no family history of colon cancer.  He reportedly had a normal colonoscopy 8 years ago, but will be out of the country for the next several years on a mission trip.  He has had chronic GERD symptoms for 15-20 years and has a history of H. Pylori.  He does not take any medications for GERD.    Past Medical History:  Diagnosis Date   GERD (gastroesophageal reflux disease)    No known health problems     Past Surgical History:  Procedure Laterality Date   APPENDECTOMY  1988    Prior to Admission medications   Medication Sig Start Date End Date Taking? Authorizing Provider  Vitamin D , Ergocalciferol , (DRISDOL ) 1.25 MG (50000 UNIT) CAPS capsule Take 1 capsule (50,000 Units total) by mouth every 7 (seven) days. 04/10/24   Jobe Mulder, DO    Current Outpatient Medications  Medication Sig Dispense Refill   Vitamin D , Ergocalciferol , (DRISDOL ) 1.25 MG (50000 UNIT) CAPS capsule Take 1 capsule (50,000 Units total) by mouth every 7 (seven) days. 12 capsule 0   No current facility-administered medications for this visit.    Allergies as of 05/02/2024   (No Known Allergies)    Family History  Problem Relation Age of Onset   Diabetes Father    Irritable bowel syndrome Sister    Diabetes Brother    Irritable bowel syndrome Brother    Cancer Neg Hx     Social History   Socioeconomic History   Marital status: Married    Spouse name: Not on file   Number of children: 2   Years of education: Not on file   Highest education level: Master's degree (e.g., MA, MS, MEng, MEd, MSW, MBA)  Occupational History   Not on file  Tobacco Use   Smoking status: Never   Smokeless tobacco: Never   Substance and Sexual Activity   Alcohol use: Not Currently   Drug use: Never   Sexual activity: Yes    Partners: Female  Other Topics Concern   Not on file  Social History Narrative   Not on file   Social Drivers of Health   Financial Resource Strain: Low Risk  (01/12/2024)   Overall Financial Resource Strain (CARDIA)    Difficulty of Paying Living Expenses: Not very hard  Food Insecurity: No Food Insecurity (01/12/2024)   Hunger Vital Sign    Worried About Running Out of Food in the Last Year: Never true    Ran Out of Food in the Last Year: Never true  Transportation Needs: No Transportation Needs (01/12/2024)   PRAPARE - Administrator, Civil Service (Medical): No    Lack of Transportation (Non-Medical): No  Physical Activity: Insufficiently Active (01/12/2024)   Exercise Vital Sign    Days of Exercise per Week: 3 days    Minutes of Exercise per Session: 40 min  Stress: No Stress Concern Present (01/12/2024)   [employer] of Occupational Health - Occupational Stress Questionnaire    Feeling of Stress : Only a little  Social Connections: Socially Integrated (01/12/2024)   Social Connection and Isolation Panel [NHANES]    Frequency of Communication with Friends and Family: More than  three times a week    Frequency of Social Gatherings with Friends and Family: More than three times a week    Attends Religious Services: More than 4 times per year    Active Member of [employer] or Organizations: Yes    Attends Engineer, structural: More than 4 times per year    Marital Status: Married  Catering manager Violence: Not on file    Review of Systems:  All other review of systems negative except as mentioned in the HPI.  Physical Exam: Vital signs BP 129/74   Pulse 89   Temp 97.8 F (36.6 C)   Ht 5' 9 (1.753 m)   Wt 145 lb (65.8 kg)   SpO2 97%   BMI 21.41 kg/m   General:   Alert,  Well-developed, well-nourished, pleasant and cooperative in  NAD Airway:  Mallampati 2 Lungs:  Clear throughout to auscultation.   Heart:  Regular rate and rhythm; no murmurs, clicks, rubs,  or gallops. Abdomen:  Soft, nontender and nondistended. Normal bowel sounds.   Neuro/Psych:  Normal mood and affect. A and O x 3   Jordell Outten E. Cherryl Corona, MD [facility]

## 2024-05-02 NOTE — Patient Instructions (Signed)

## 2024-05-02 NOTE — Progress Notes (Signed)
 Discharge instruction reviewed with Interpreter # [phone]

## 2024-05-03 ENCOUNTER — Telehealth: Payer: Self-pay | Admitting: *Deleted

## 2024-05-03 NOTE — Telephone Encounter (Signed)
  Follow up Call-     05/02/2024    8:03 AM  Call back number  Post procedure Call Back phone  # [phone]  Permission to leave phone message Yes     Patient questions:  Patient was HIPAA. Unable to reach relative.

## 2024-05-07 LAB — SURGICAL PATHOLOGY

## 2024-05-08 ENCOUNTER — Ambulatory Visit: Payer: Self-pay | Admitting: Gastroenterology

## 2024-05-08 DIAGNOSIS — A048 Other specified bacterial intestinal infections: Secondary | ICD-10-CM

## 2024-05-08 NOTE — Progress Notes (Signed)
 Micheal Hanson,  The biopsies from your recent upper GI Endoscopy were notable for H. Pylori gastritis.  H. pylori is a common bacteria which can cause chronic symptoms of abdominal pain, nausea and bloating.  It can also cause iron deficiency anemia as well as increase the risk of stomach ulcers and stomach cancer.  We need to eradicate the bacteria.  Sometimes the bacteria is very difficult to eradicate, so it is  important to take the medications as directed. Please take the medications below:  1) Omeprazole 20 mg 2 times a day x 14 d 2) Pepto Bismol 2 tabs (262 mg each) 4 times a day x 14 d 3) Metronidazole 250 mg 4 times a day x 14 d 4) doxycycline 100 mg 2 times a day x 14 d  4 weeks after treatment completed, check H. Pylori stool antigen to confirm eradication (must be off acid suppression therapy for 2 weeks prior to specimen submission)  Dx: H. 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  This finding is thought to represent an increased risk of stomach cancer in some patients.  Patients at highest risk are those patients with a family history of stomach cancer and those with a smoking history, as well as patients who are from areas with high rates of stomach cancer. Periodic surveillance (repeat endoscopy with biopsies) is often performed to monitor for development of cancer or further precancerous changes.  I recommend you repeat an upper endoscopy in 3 years to look for further precancerous changes in the stomach.  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  This is a completely benign cholesterol deposit in the lining of the stomach.  No further follow-up is needed.  One polyp which I removed during your colonoscopy was proven to be completely benign but is considered a pre-cancerous polyp that MAY have grown into cancer if it had not been removed.  The other polyps in your  rectum were not precancerous.  Studies shows that at least 20% of women over age 72 and 30% of men over age 35 have pre-cancerous polyps.  Based on current nationally recognized surveillance guidelines, I recommend that you have a repeat colonoscopy in 7 years.   If you develop any new rectal bleeding, abdominal pain or significant bowel habit changes, please contact me before then.   Team, Please relay the above information via interpreter and placed the corresponding prescriptions and lab orders

## 2024-05-09 ENCOUNTER — Other Ambulatory Visit (HOSPITAL_BASED_OUTPATIENT_CLINIC_OR_DEPARTMENT_OTHER): Payer: Self-pay

## 2024-05-09 MED ORDER — ZOSTER VAC RECOMB ADJUVANTED 50 MCG/0.5ML IM SUSR
0.5000 mL | Freq: Once | INTRAMUSCULAR | 0 refills | Status: AC
  Filled 2024-05-09: qty 0.5, 1d supply, fill #0

## 2024-05-09 MED ORDER — METRONIDAZOLE 500 MG PO TABS
250.0000 mg | ORAL_TABLET | Freq: Four times a day (QID) | ORAL | 0 refills | Status: AC
  Filled 2024-05-09: qty 28, 14d supply, fill #0

## 2024-05-09 MED ORDER — DOXYCYCLINE HYCLATE 100 MG PO TABS
100.0000 mg | ORAL_TABLET | Freq: Two times a day (BID) | ORAL | 0 refills | Status: AC
  Filled 2024-05-09: qty 28, 14d supply, fill #0

## 2024-05-09 MED ORDER — OMEPRAZOLE 20 MG PO CPDR
20.0000 mg | DELAYED_RELEASE_CAPSULE | Freq: Two times a day (BID) | ORAL | 0 refills | Status: AC
  Filled 2024-05-09: qty 28, 14d supply, fill #0

## 2024-05-09 MED ORDER — BISMUTH SUBSALICYLATE 262 MG PO CHEW
524.0000 mg | CHEWABLE_TABLET | Freq: Four times a day (QID) | ORAL | 0 refills | Status: AC
  Filled 2024-05-09: qty 120, 15d supply, fill #0

## 2024-05-28 ENCOUNTER — Ambulatory Visit (HOSPITAL_BASED_OUTPATIENT_CLINIC_OR_DEPARTMENT_OTHER)
Admission: RE | Admit: 2024-05-28 | Discharge: 2024-05-28 | Disposition: A | Discharge status: Home / Self Care | Source: Ambulatory Visit | Attending: Family Medicine | Admitting: Family Medicine

## 2024-05-28 ENCOUNTER — Other Ambulatory Visit (HOSPITAL_BASED_OUTPATIENT_CLINIC_OR_DEPARTMENT_OTHER): Payer: Self-pay

## 2024-05-28 DIAGNOSIS — Z13820 Encounter for screening for osteoporosis: Secondary | ICD-10-CM | POA: Diagnosis not present

## 2024-05-28 DIAGNOSIS — M8580 Other specified disorders of bone density and structure, unspecified site: Secondary | ICD-10-CM | POA: Diagnosis present

## 2024-05-28 DIAGNOSIS — M8589 Other specified disorders of bone density and structure, multiple sites: Secondary | ICD-10-CM | POA: Insufficient documentation

## 2024-06-14 NOTE — Telephone Encounter (Signed)
 With the help of [employer]) interpreter ID [phone], I have spoken to patient to ask that he come next week for H Pylori stool study and return to our lab as soon as possible. Patient verbalizes understanding of this information.

## 2024-06-18 ENCOUNTER — Other Ambulatory Visit: Payer: Self-pay

## 2024-06-19 ENCOUNTER — Other Ambulatory Visit

## 2024-06-19 DIAGNOSIS — A048 Other specified bacterial intestinal infections: Secondary | ICD-10-CM | POA: Diagnosis not present

## 2024-06-20 LAB — HELICOBACTER PYLORI  SPECIAL ANTIGEN
MICRO NUMBER:: 16759460
SPECIMEN QUALITY: ADEQUATE

## 2024-06-27 ENCOUNTER — Ambulatory Visit: Payer: Self-pay | Admitting: Gastroenterology

## 2024-06-27 NOTE — Progress Notes (Signed)
 Micheal Hanson,  The stool test for H. Pylori was negative, indicating that the bacteria was successfully eradicated with the antibiotics.  No further evaluation or treatment is needed.
